# Patient Record
Sex: Male | Born: 1995 | Race: White | Hispanic: No | Marital: Single | Smoking: Never smoker
Health system: Southern US, Community
[De-identification: ages and names within clinical notes are randomized; demographics above are authoritative.]

## PROBLEM LIST (undated history)

## (undated) DIAGNOSIS — K219 Gastro-esophageal reflux disease without esophagitis: Secondary | ICD-10-CM

## (undated) DIAGNOSIS — J45909 Unspecified asthma, uncomplicated: Secondary | ICD-10-CM

## (undated) HISTORY — DX: Gastro-esophageal reflux disease without esophagitis: K21.9

## (undated) HISTORY — DX: Unspecified asthma, uncomplicated: J45.909

---
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
## 2017-07-06 ENCOUNTER — Encounter (INDEPENDENT_AMBULATORY_CARE_PROVIDER_SITE_OTHER): Payer: Self-pay | Admitting: Orthopaedic Surgery

## 2017-07-06 ENCOUNTER — Ambulatory Visit (INDEPENDENT_AMBULATORY_CARE_PROVIDER_SITE_OTHER): Payer: Self-pay

## 2017-07-06 ENCOUNTER — Ambulatory Visit (INDEPENDENT_AMBULATORY_CARE_PROVIDER_SITE_OTHER): Payer: BLUE CROSS/BLUE SHIELD | Admitting: Orthopaedic Surgery

## 2017-07-06 VITALS — BP 153/96 | HR 101 | Ht 69.0 in | Wt 191.0 lb

## 2017-07-06 DIAGNOSIS — M25561 Pain in right knee: Secondary | ICD-10-CM

## 2017-07-06 DIAGNOSIS — G8929 Other chronic pain: Secondary | ICD-10-CM

## 2017-07-06 NOTE — Progress Notes (Signed)
Office Visit Note   Patient: Randy Ewing           Date of Birth: [birth year]           MRN: [id number] Visit Date: 07/06/2017              Requested by: No referring provider defined for this encounter. PCP: No primary care provider on file.   Assessment & Plan: Visit Diagnoses:  1. Chronic pain of right knee     Plan: Pain off and on right knee for over a year. No obvious injury or trauma. Didn't play football when he was younger. 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 On discussion regarding diagnosis and treatment options. He like to work on Dance movement psychotherapistquad strengthening exercises. We'll plan to see back as needed. We can always consider "cortisone injection possibly even an MRI scan. No evidence of instability or meniscal tear  Follow-Up Instructions: Return if symptoms worsen or fail to improve.   Orders:  Orders Placed This Encounter  Procedures  . XR KNEE 3 VIEW RIGHT   No orders of the defined types were placed in this encounter.     Procedures: No procedures performed   Clinical Data: No additional findings.   Subjective: Chief Complaint  Patient presents with  . Right Knee - Pain    Randy Ewing is a 22 y o here today for Right knee pain x 9 months. 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  Insidious onset of pain for up to ER. No injury or trauma. Didn't play football and lacrosse when he was younger. No effusion. Pain is localized anteriorly and does have some difficulty with deep knee bends and squats. Is concerned about taking NSAIDs as there is a family history of kidney problems. No instability symptoms  HPI  Review of Systems  Constitutional: Negative for fatigue.  HENT: Negative for hearing loss.   Respiratory: Negative for apnea, chest tightness and shortness of breath.   Cardiovascular: Negative for chest pain, palpitations and leg swelling.    Gastrointestinal: Negative for blood in stool, constipation and diarrhea.  Genitourinary: Negative for difficulty urinating.  Musculoskeletal: Negative for arthralgias, back pain, joint swelling, myalgias, neck pain and neck stiffness.  Neurological: Positive for headaches. Negative for weakness and numbness.  Hematological: Does not bruise/bleed easily.  Psychiatric/Behavioral: Negative for sleep disturbance. The patient is not nervous/anxious.      Objective: Vital Signs: BP (!) 153/96   Pulse (!) 101   Ht 5\' 9"  (1.753 m)   Wt 191 lb (86.6 kg)   BMI 28.21 kg/m   Physical Exam  Ortho Exam awake alert and oriented 3 comfortable sitting. No limp. No effusion right knee. No evidence of instability with a varus or valgus stress. Negative anterior drawer sign. No pain along either medial lateral joint. Considerable patellar crepitation but no pain with compression. Does have a palpable plica with mild discomfort. Oh popliteal pain. No calf discomfort. Neurovascular exam intact.  Specialty Comments:  No specialty comments available.  Imaging: Xr Knee 3 View Right  Result Date: 07/06/2017 Films of the right knee were obtained in 3 projections standing. Normal alignment. No ectopic calcification. Joint spaces are well maintained. No obvious abnormalities    PMFS History: Patient Active Problem List   Diagnosis Date Noted  . GERD (gastroesophageal reflux disease) 09/22/2011  . Emesis 09/19/2011  . Chronic diarrhea of unknown  origin 05/15/2011  . Gastroesophageal reflux    Past Medical History:  Diagnosis Date  . Gastroesophageal reflux   . RAD (reactive airway disease)     History reviewed. No pertinent family history.  History reviewed. No pertinent surgical history. Social History   Occupational History  . Not on file  Tobacco Use  . Smoking status: Never Smoker  . Smokeless tobacco: Never Used  Substance and Sexual Activity  . Alcohol use: No  . Drug use: No  .  Sexual activity: No

## 2018-12-06 ENCOUNTER — Other Ambulatory Visit: Payer: Self-pay | Admitting: *Deleted

## 2018-12-06 DIAGNOSIS — Z20822 Contact with and (suspected) exposure to covid-19: Secondary | ICD-10-CM

## 2018-12-06 NOTE — Addendum Note (Signed)
Addended by: Yazmina Pareja M on: 12/06/2018 09:01 PM   Modules accepted: Orders  

## 2018-12-08 LAB — NOVEL CORONAVIRUS, NAA: SARS-CoV-2, NAA: NOT DETECTED

## 2022-09-11 DIAGNOSIS — Z23 Encounter for immunization: Secondary | ICD-10-CM | POA: Diagnosis not present

## 2022-09-11 DIAGNOSIS — R030 Elevated blood-pressure reading, without diagnosis of hypertension: Secondary | ICD-10-CM | POA: Diagnosis not present

## 2022-09-11 DIAGNOSIS — E559 Vitamin D deficiency, unspecified: Secondary | ICD-10-CM | POA: Diagnosis not present

## 2022-09-11 DIAGNOSIS — R42 Dizziness and giddiness: Secondary | ICD-10-CM | POA: Diagnosis not present

## 2023-01-21 DIAGNOSIS — Z13228 Encounter for screening for other metabolic disorders: Secondary | ICD-10-CM | POA: Diagnosis not present

## 2023-01-21 DIAGNOSIS — E559 Vitamin D deficiency, unspecified: Secondary | ICD-10-CM | POA: Diagnosis not present

## 2023-01-21 DIAGNOSIS — Z131 Encounter for screening for diabetes mellitus: Secondary | ICD-10-CM | POA: Diagnosis not present

## 2023-01-21 DIAGNOSIS — Z13220 Encounter for screening for lipoid disorders: Secondary | ICD-10-CM | POA: Diagnosis not present

## 2023-01-21 DIAGNOSIS — Z0000 Encounter for general adult medical examination without abnormal findings: Secondary | ICD-10-CM | POA: Diagnosis not present

## 2023-01-21 DIAGNOSIS — Z8271 Family history of polycystic kidney: Secondary | ICD-10-CM | POA: Diagnosis not present

## 2023-01-21 DIAGNOSIS — Z1329 Encounter for screening for other suspected endocrine disorder: Secondary | ICD-10-CM | POA: Diagnosis not present

## 2023-01-21 DIAGNOSIS — Z23 Encounter for immunization: Secondary | ICD-10-CM | POA: Diagnosis not present

## 2023-01-22 ENCOUNTER — Other Ambulatory Visit: Payer: Self-pay | Admitting: Family Medicine

## 2023-01-22 DIAGNOSIS — Z8271 Family history of polycystic kidney: Secondary | ICD-10-CM

## 2023-03-10 ENCOUNTER — Ambulatory Visit
Admission: RE | Admit: 2023-03-10 | Discharge: 2023-03-10 | Disposition: A | Discharge status: Home / Self Care | Payer: 59 | Source: Ambulatory Visit | Attending: Family Medicine | Admitting: Family Medicine

## 2023-03-10 DIAGNOSIS — Z8271 Family history of polycystic kidney: Secondary | ICD-10-CM | POA: Diagnosis not present

## 2024-05-22 ENCOUNTER — Ambulatory Visit
Admission: EM | Admit: 2024-05-22 | Discharge: 2024-05-22 | Disposition: A | Discharge status: Home / Self Care | Attending: Nurse Practitioner | Admitting: Nurse Practitioner

## 2024-05-22 ENCOUNTER — Other Ambulatory Visit: Payer: Self-pay

## 2024-05-22 ENCOUNTER — Ambulatory Visit

## 2024-05-22 DIAGNOSIS — R079 Chest pain, unspecified: Secondary | ICD-10-CM | POA: Diagnosis not present

## 2024-05-22 MED ORDER — ALUM & MAG HYDROXIDE-SIMETH 200-200-20 MG/5ML PO SUSP
30.0000 mL | Freq: Once | ORAL | Status: AC
  Administered 2024-05-22: 30 mL via ORAL

## 2024-05-22 MED ORDER — LIDOCAINE VISCOUS HCL 2 % MT SOLN
15.0000 mL | Freq: Once | OROMUCOSAL | Status: AC
  Administered 2024-05-22: 15 mL via OROMUCOSAL

## 2024-05-22 NOTE — ED Triage Notes (Signed)
 Pt states left sided chest pain yesterday states it woke him from his sleep.  States he was also having numbness and tingling to one of his arms as well.

## 2024-05-22 NOTE — ED Provider Notes (Signed)
 " [facility]    CSN: [id number] Arrival date & time: 05/22/24  1231      History   Chief Complaint Chief Complaint  Patient presents with   Chest Pain    HPI Randy Ewing is a 29 y.o. male.   The history is provided by the patient.   Patient presents for complaints of chest pain that began in the early morning hours of the prior day.  He states that the pain was intense and lasted for approximately 15 to 20 minutes.  He states when he had the pain, he experienced some numbness and tingling in the left hand and arm, also states that he has some shortness of breath and was still waiting.  Patient states that the pain did eventually improve, but states that he did have ongoing chest pain over the past 24 hours.  He rates his pain 2-3/10 at present.  Patient reports prior history of anxiety and reflux.  He states that the pain did not radiate into the left jaw or down the arm.  States that he has not had any issues with his reflux since he was a teenager.  Patient reports that he also has underlying history of anxiety and panic attacks.  States that he has been under more stress than usual.  Patient states that he currently does not take any medication for panic attack or stress.  Past Medical History:  Diagnosis Date   Gastroesophageal reflux    RAD (reactive airway disease)     Patient Active Problem List   Diagnosis Date Noted   GERD (gastroesophageal reflux disease) 09/22/2011   Emesis 09/19/2011   Chronic diarrhea of unknown origin 05/15/2011   Gastroesophageal reflux     History reviewed. No pertinent surgical history.     Home Medications    Prior to Admission medications  Medication Sig Start Date End Date Taking? Authorizing Provider  ampicillin (PRINCIPEN) 250 MG capsule Take 500 mg by mouth 4 (four) times daily.     Joshua Blamer, MD  ampicillin (PRINCIPEN) 500 MG capsule  03/18/11   [provider]  azithromycin  (ZITHROMAX ) 250 MG tablet   03/01/11   [provider]  esomeprazole  (NEXIUM ) 40 MG capsule Take 1 capsule (40 mg total) by mouth daily before breakfast. 06/05/11 06/04/12  Gretta Fairy DEL, MD  famotidine-calcium carbonate-magnesium  hydroxide (PEPCID COMPLETE) 10-800-165 MG CHEW Chew 1 tablet by mouth daily as needed.   05/14/10   [provider]  fluticasone  (FLONASE ) 50 MCG/ACT nasal spray Place 1 spray into the nose daily. 05/05/11 05/04/12  Ramgoolam, Andres, MD  sertraline (ZOLOFT) 50 MG tablet Take 50 mg by mouth daily.    Melba Alm CROME, MD    Family History History reviewed. No pertinent family history.  Social History Social History[1]   Allergies   Patient has no known allergies.   Review of Systems Review of Systems Per HPI  Physical Exam Triage Vital Signs ED Triage Vitals  Encounter Vitals Group     BP 05/22/24 1258 (!) 145/84     Girls Systolic BP Percentile --      Girls Diastolic BP Percentile --      Boys Systolic BP Percentile --      Boys Diastolic BP Percentile --      Pulse Rate 05/22/24 1258 84     Resp 05/22/24 1258 16     Temp 05/22/24 1258 98.1 F (36.7 C)     Temp Source 05/22/24 1258 Oral  SpO2 05/22/24 1258 97 %     Weight --      Height --      Head Circumference --      Peak Flow --      Pain Score 05/22/24 1255 2     Pain Loc --      Pain Education --      Exclude from Growth Chart --    No data found.  Updated Vital Signs BP (!) 145/84 (BP Location: Right Arm)   Pulse 84   Temp 98.1 F (36.7 C) (Oral)   Resp 16   SpO2 97%   Visual Acuity Right Eye Distance:   Left Eye Distance:   Bilateral Distance:    Right Eye Near:   Left Eye Near:    Bilateral Near:     Physical Exam Vitals and nursing note reviewed.  Constitutional:      General: He is not in acute distress.    Appearance: He is well-developed.  HENT:     Head: Normocephalic.     Mouth/Throat:     Mouth: Mucous membranes are moist.  Eyes:     Pupils: Pupils are  equal, round, and reactive to light.  Cardiovascular:     Rate and Rhythm: Normal rate and regular rhythm.     Pulses: Normal pulses.     Heart sounds: Normal heart sounds.  Pulmonary:     Effort: Pulmonary effort is normal. No tachypnea, accessory muscle usage or respiratory distress.     Breath sounds: Normal breath sounds. No stridor.  Chest:     Chest wall: Tenderness (Tenderness noted to the left chest with palpation.) present. No deformity.  Abdominal:     General: Bowel sounds are normal.     Palpations: Abdomen is soft.     Comments: GI cocktail was administered with no change in the patient's symptoms.  Musculoskeletal:     Cervical back: Normal range of motion.  Skin:    General: Skin is warm and dry.  Neurological:     General: No focal deficit present.     Mental Status: He is alert and oriented to person, place, and time.  Psychiatric:        Mood and Affect: Mood normal.        Behavior: Behavior normal.      UC Treatments / Results  Labs (all labs ordered are listed, but only abnormal results are displayed) Labs Reviewed  COMPREHENSIVE METABOLIC PANEL WITH GFR  CBC WITH DIFFERENTIAL/PLATELET    EKG: Normal sinus rhythm, no ectopy, no STEMI.  Compared to EKG dated 10/05/2018 2012, no other comparisons available.   Radiology DG Chest 2 View Result Date: 05/22/2024 EXAM: 2 VIEW(S) XRAY OF THE CHEST 05/22/2024 01:53:01 PM COMPARISON: 6 / 14 / 13 CLINICAL HISTORY: chest pain x 1 day FINDINGS: LUNGS AND PLEURA: No focal pulmonary opacity. No pleural effusion. No pneumothorax. HEART AND MEDIASTINUM: No acute abnormality of the cardiac and mediastinal silhouettes. BONES AND SOFT TISSUES: No acute osseous abnormality. IMPRESSION: 1. No acute cardiopulmonary pathology. Electronically signed by: Norman Gatlin MD 05/22/2024 02:01 PM EST RP Workstation: [id number]    Procedures Procedures (including critical care time)  Medications Ordered in UC Medications  alum &  mag hydroxide-simeth (MAALOX/MYLANTA) 200-200-20 MG/5ML suspension 30 mL (30 mLs Oral Given 05/22/24 1340)  lidocaine  (XYLOCAINE ) 2 % viscous mouth solution 15 mL (15 mLs Mouth/Throat Given 05/22/24 1340)    Initial Impression / Assessment and Plan / UC Course  I have reviewed the triage vital signs and the nursing notes.  Pertinent labs & imaging results that were available during my care of the patient were reviewed by me and considered in my medical decision making (see chart for details).  Patient presents for complaints of chest pain over the past 24 hours.  On exam, lung sounds are clear throughout, he is well-appearing, is in no acute distress, vital signs are stable.  EKG shows normal sinus rhythm, no ectopy, no STEMI, chest x-ray is also negative.  CBC and CMP were collected for safety.  GI cocktail was administered with no change to the patient's symptoms.  Patient also with underlying history of anxiety and panic attacks.  Patient's exam and findings for cardiac workup were negative at this time; however cannot rule out cardiac etiology given the symptoms that he experienced..  Supportive care recommendations were provided and discussed with the patient to include fluids, rest, over-the-counter analgesics, and things to avoid that may trigger stress or anxiety.  Patient was given strict ER follow-up precautions, along with recommendations to follow-up with his primary care physician for reevaluation of his chest pain.  Patient was in agreement with this plan of care and verbalizes understanding.  All questions were answered.  Patient stable for discharge.   Final Clinical Impressions(s) / UC Diagnoses   Final diagnoses:  Chest pain, unspecified type     Discharge Instructions      The EKG and chest x-ray were normal. Lab results are pending.  You will be contacted if the pending test results are abnormal.  You will also have access to the results via MyChart. You were given a GI  cocktail during your visit today. Increase fluids and allow for plenty of rest. You may take over-the-counter Tylenol or ibuprofen as needed for pain or discomfort. Try to avoid things that may trigger stress and anxiety. Recommend follow-up with your primary care physician for reevaluation within the next 7 to 10 days. Go to the emergency department if you experience another onset of chest pain, shortness of breath, difficulty breathing, or other concerns. Follow-up as needed.     ED Prescriptions   None    PDMP not reviewed this encounter.     [1]  Social History Tobacco Use   Smoking status: Never   Smokeless tobacco: Never  Substance Use Topics   Alcohol use: No   Drug use: No     Gilmer Etta PARAS, NP 05/22/24 1430  "

## 2024-05-22 NOTE — Discharge Instructions (Addendum)
 The EKG and chest x-ray were normal. Lab results are pending.  You will be contacted if the pending test results are abnormal.  You will also have access to the results via MyChart. You were given a GI cocktail during your visit today. Increase fluids and allow for plenty of rest. You may take over-the-counter Tylenol or ibuprofen as needed for pain or discomfort. Try to avoid things that may trigger stress and anxiety. Recommend follow-up with your primary care physician for reevaluation within the next 7 to 10 days. Go to the emergency department if you experience another onset of chest pain, shortness of breath, difficulty breathing, or other concerns. Follow-up as needed.

## 2024-05-23 LAB — CBC WITH DIFFERENTIAL/PLATELET
Basophils Absolute: 0.1 x10E3/uL (ref 0.0–0.2)
Basos: 1 %
EOS (ABSOLUTE): 0.3 x10E3/uL (ref 0.0–0.4)
Eos: 3 %
Hematocrit: 43.9 % (ref 37.5–51.0)
Hemoglobin: 14.8 g/dL (ref 13.0–17.7)
Immature Grans (Abs): 0 x10E3/uL (ref 0.0–0.1)
Immature Granulocytes: 0 %
Lymphocytes Absolute: 3.6 x10E3/uL — ABNORMAL HIGH (ref 0.7–3.1)
Lymphs: 34 %
MCH: 31.4 pg (ref 26.6–33.0)
MCHC: 33.7 g/dL (ref 31.5–35.7)
MCV: 93 fL (ref 79–97)
Monocytes Absolute: 0.9 x10E3/uL (ref 0.1–0.9)
Monocytes: 8 %
Neutrophils Absolute: 5.8 x10E3/uL (ref 1.4–7.0)
Neutrophils: 54 %
Platelets: 266 x10E3/uL (ref 150–450)
RBC: 4.71 x10E6/uL (ref 4.14–5.80)
RDW: 13.2 % (ref 11.6–15.4)
WBC: 10.8 x10E3/uL (ref 3.4–10.8)

## 2024-05-23 LAB — COMPREHENSIVE METABOLIC PANEL WITH GFR
ALT: 59 IU/L — ABNORMAL HIGH (ref 0–44)
AST: 27 IU/L (ref 0–40)
Albumin: 4.5 g/dL (ref 4.3–5.2)
Alkaline Phosphatase: 73 IU/L (ref 47–123)
BUN/Creatinine Ratio: 27 — ABNORMAL HIGH (ref 9–20)
BUN: 21 mg/dL — ABNORMAL HIGH (ref 6–20)
Bilirubin Total: 0.2 mg/dL (ref 0.0–1.2)
CO2: 22 mmol/L (ref 20–29)
Calcium: 9.2 mg/dL (ref 8.7–10.2)
Chloride: 102 mmol/L (ref 96–106)
Creatinine, Ser: 0.77 mg/dL (ref 0.76–1.27)
Globulin, Total: 2.2 g/dL (ref 1.5–4.5)
Glucose: 87 mg/dL (ref 70–99)
Potassium: 4.9 mmol/L (ref 3.5–5.2)
Sodium: 136 mmol/L (ref 134–144)
Total Protein: 6.7 g/dL (ref 6.0–8.5)
eGFR: 124 mL/min/1.73
